# Patient Record
Sex: Female | Born: 1965 | Race: White | Marital: Married | Smoking: Never smoker
Health system: Southern US, Community
[De-identification: ages and names within clinical notes are randomized; demographics above are authoritative.]

## PROBLEM LIST (undated history)

## (undated) DIAGNOSIS — N3010 Interstitial cystitis (chronic) without hematuria: Secondary | ICD-10-CM

## (undated) HISTORY — PX: BLADDER SURGERY: SHX569

## (undated) HISTORY — PX: ABDOMINAL HYSTERECTOMY: SHX81

---

## 2015-06-02 ENCOUNTER — Other Ambulatory Visit: Payer: Self-pay | Admitting: Family Medicine

## 2015-06-02 ENCOUNTER — Ambulatory Visit
Admission: RE | Admit: 2015-06-02 | Discharge: 2015-06-02 | Disposition: A | Discharge status: Home / Self Care | Payer: BLUE CROSS/BLUE SHIELD | Source: Ambulatory Visit | Attending: Family Medicine | Admitting: Family Medicine

## 2015-06-02 DIAGNOSIS — M25511 Pain in right shoulder: Secondary | ICD-10-CM

## 2017-07-21 ENCOUNTER — Encounter (HOSPITAL_BASED_OUTPATIENT_CLINIC_OR_DEPARTMENT_OTHER): Payer: Self-pay

## 2017-07-21 ENCOUNTER — Other Ambulatory Visit: Payer: Self-pay

## 2017-07-21 ENCOUNTER — Emergency Department (HOSPITAL_BASED_OUTPATIENT_CLINIC_OR_DEPARTMENT_OTHER)
Admission: EM | Admit: 2017-07-21 | Discharge: 2017-07-21 | Disposition: A | Discharge status: Home / Self Care | Payer: BLUE CROSS/BLUE SHIELD | Attending: Emergency Medicine | Admitting: Emergency Medicine

## 2017-07-21 DIAGNOSIS — L03115 Cellulitis of right lower limb: Secondary | ICD-10-CM | POA: Diagnosis not present

## 2017-07-21 DIAGNOSIS — L02415 Cutaneous abscess of right lower limb: Secondary | ICD-10-CM | POA: Diagnosis not present

## 2017-07-21 DIAGNOSIS — R110 Nausea: Secondary | ICD-10-CM | POA: Insufficient documentation

## 2017-07-21 DIAGNOSIS — L0390 Cellulitis, unspecified: Secondary | ICD-10-CM

## 2017-07-21 HISTORY — DX: Interstitial cystitis (chronic) without hematuria: N30.10

## 2017-07-21 MED ORDER — LIDOCAINE-EPINEPHRINE (PF) 1 %-1:200000 IJ SOLN
INTRAMUSCULAR | Status: AC
  Administered 2017-07-21: 10 mL
  Filled 2017-07-21: qty 10

## 2017-07-21 MED ORDER — DOXYCYCLINE HYCLATE 100 MG PO TABS
100.0000 mg | ORAL_TABLET | Freq: Once | ORAL | Status: AC
  Administered 2017-07-21: 100 mg via ORAL
  Filled 2017-07-21: qty 1

## 2017-07-21 MED ORDER — DOXYCYCLINE HYCLATE 100 MG PO CAPS: 100.0000 mg | ORAL_CAPSULE | Freq: Two times a day (BID) | ORAL | 0 refills | Status: AC

## 2017-07-21 MED ORDER — HYDROCODONE-ACETAMINOPHEN 5-325 MG PO TABS
1.0000 | ORAL_TABLET | Freq: Once | ORAL | Status: AC
  Administered 2017-07-21: 1 via ORAL
  Filled 2017-07-21: qty 1

## 2017-07-21 MED ORDER — HYDROCODONE-ACETAMINOPHEN 5-325 MG PO TABS: 1.0000 | ORAL_TABLET | Freq: Three times a day (TID) | ORAL | 0 refills | Status: AC | PRN

## 2017-07-21 MED ORDER — LIDOCAINE-EPINEPHRINE 2 %-1:100000 IJ SOLN
20.0000 mL | Freq: Once | INTRAMUSCULAR | Status: DC
  Filled 2017-07-21: qty 20

## 2017-07-21 NOTE — ED Triage Notes (Signed)
C/o ?abscess to right buttock area x 5 days-NAD-steady gait

## 2017-07-21 NOTE — Discharge Instructions (Addendum)
You were given a prescription for antibiotics. Please take the antibiotic prescription fully.   You were given a prescription for Norco.  You may take 1 Norco every 8 hours for your pain.  Do not take this medication when you are working, driving operating machinery or drinking alcohol.  Please follow-up for a wound recheck in 48 hours. Please return to the emergency room immediately if you experience any new or worsening symptoms or any symptoms that indicate worsening infection such as fevers, increased redness/swelling/pain, warmth, or drainage from the affected area.

## 2017-07-21 NOTE — ED Provider Notes (Signed)
[facility] EMERGENCY DEPARTMENT Provider Note   CSN: [id number] Arrival date & time: 07/21/17  1950     History   Chief Complaint Chief Complaint  Patient presents with  . Abscess    HPI April Castro is a 52 y.o. female.  HPI   Pt is a 52 yo female presenting to the ED today to be evaluated for an abscess to her right upper leg that began 5 days ago. States the area is red and painfull. Denies itching. States redness, swelling, and pain has been worsening.  States that pain in her leg radiates to her right lower back. No numbness to legs. No drainage from the area.   States she has been taking "colloidal silver" that she received at a health shop to help with the infection. She has been feeling nauseated after taking this medication.   Reports temp at home today was 100F. She did not take any medication to tx this. Denies abd pain or vomiting. Has chronic diarrhea. No other sxs.   Past Medical History:  Diagnosis Date  . IC (interstitial cystitis)     There are no active problems to display for this patient.   Past Surgical History:  Procedure Laterality Date  . ABDOMINAL HYSTERECTOMY    . BLADDER SURGERY       OB History   None      Home Medications    Prior to Admission medications   Medication Sig Start Date End Date Taking? Authorizing Provider  doxycycline (VIBRAMYCIN) 100 MG capsule Take 1 capsule (100 mg total) by mouth 2 (two) times daily for 7 days. 07/21/17 07/28/17  Needham Biggins S, PA-C  HYDROcodone-acetaminophen (NORCO/VICODIN) 5-325 MG tablet Take 1 tablet by mouth every 8 (eight) hours as needed. 07/21/17   Eithen Castiglia S, PA-C    Family History No family history on file.  Social History Social History   Tobacco Use  . Smoking status: Never Smoker  . Smokeless tobacco: Never Used  Substance Use Topics  . Alcohol use: Never    Frequency: Never  . Drug use: Never     Allergies   Septra  [sulfamethoxazole-trimethoprim]   Review of Systems Review of Systems  Constitutional: Negative for fever.  HENT: Negative for congestion.   Eyes: Negative for visual disturbance.  Respiratory: Negative for shortness of breath.   Cardiovascular: Negative for chest pain.  Gastrointestinal: Positive for diarrhea and nausea. Negative for abdominal pain, constipation and vomiting.  Genitourinary: Negative for flank pain.  Musculoskeletal: Positive for back pain.  Skin: Positive for color change and wound.  Neurological: Negative for headaches.     Physical Exam Updated Vital Signs BP 139/89 (BP Location: Left Arm)   Pulse 90   Temp 98.7 F (37.1 C) (Oral)   Resp 20   Ht 5\' 3"  (1.6 m)   Wt 62.6 kg (138 lb 0.1 oz)   SpO2 100%   BMI 24.45 kg/m   Physical Exam  Constitutional: She appears well-developed and well-nourished. No distress.  HENT:  Head: Normocephalic and atraumatic.  Eyes: Conjunctivae are normal.  Neck: Neck supple.  Cardiovascular: Normal rate, regular rhythm and normal heart sounds.  Pulmonary/Chest: Effort normal and breath sounds normal. She has no wheezes.  Abdominal: Soft. Bowel sounds are normal. There is no tenderness.  Musculoskeletal: Normal range of motion.  Neurological: She is alert.  Skin: Skin is warm and dry.  area of erythema and induration beneath the right buttock with overlying scab.  No drainage  noted.  Area is warm to touch.  No significant fluctuance noted.  Psychiatric: She has a normal mood and affect.  Nursing note and vitals reviewed.    ED Treatments / Results  Labs (all labs ordered are listed, but only abnormal results are displayed) Labs Reviewed - No data to display  EKG None  Radiology No results found.  Procedures .Marland KitchenIncision and Drainage Date/Time: 07/21/2017 10:14 PM Performed by: Karrie Meres, PA-C Authorized by: Karrie Meres, PA-C   Consent:    Consent obtained:  Verbal   Consent given by:  Patient  and parent   Risks discussed:  Bleeding, incomplete drainage and pain Location:    Type:  Abscess   Location:  Lower extremity Pre-procedure details:    Skin preparation:  Betadine Anesthesia (see MAR for exact dosages):    Anesthesia method:  Local infiltration   Local anesthetic:  Lidocaine 2% WITH epi Procedure type:    Complexity:  Simple Procedure details:    Incision types:  Stab incision   Scalpel blade:  11   Drainage:  Bloody   Drainage amount:  Scant   Wound treatment:  Wound left open   Packing materials:  None Post-procedure details:    Patient tolerance of procedure:  Tolerated well, no immediate complications   (including critical care time)  Medications Ordered in ED Medications  lidocaine-EPINEPHrine (XYLOCAINE W/EPI) 2 %-1:100000 (with pres) injection 20 mL (has no administration in time range)  lidocaine-EPINEPHrine (XYLOCAINE-EPINEPHrine) 1 %-1:200000 (PF) injection (has no administration in time range)  doxycycline (VIBRA-TABS) tablet 100 mg (has no administration in time range)  HYDROcodone-acetaminophen (NORCO/VICODIN) 5-325 MG per tablet 1 tablet (1 tablet Oral Given 07/21/17 2153)     Initial Impression / Assessment and Plan / ED Course  I have reviewed the triage vital signs and the nursing notes.  Pertinent labs & imaging results that were available during my care of the patient were reviewed by me and considered in my medical decision making (see chart for details).     Final Clinical Impressions(s) / ED Diagnoses   Final diagnoses:  Cellulitis, unspecified cellulitis site   Patient with skin abscess/cellulitis.  Incision and drainage was attempted and scant blood drained.  Abscess was not large enough to warrant packing or drain,  wound recheck in 2 days. Encouraged home warm soaks and flushing.  Mild signs of cellulitis is surrounding skin.  Area marked and pt encouraged to return if redness begins to streak, extends beyond the markings, and/or  fever or nausea/vomiting develop.  Pt is alert, oriented, NAD, afebrile, non tachycardic, nonseptic and nontoxic appearing.  Pt to be d/c on oral antibiotics with strict f/u instructions.  Also gave pain medication.  Return precautions discussed.   ED Discharge Orders        Ordered    doxycycline (VIBRAMYCIN) 100 MG capsule  2 times daily     07/21/17 2213    HYDROcodone-acetaminophen (NORCO/VICODIN) 5-325 MG tablet  Every 8 hours PRN     07/21/17 2213       Karrie Meres, PA-C 07/21/17 2216    Terrilee Files, MD 07/22/17 1240

## 2018-08-21 ENCOUNTER — Other Ambulatory Visit: Payer: Self-pay | Admitting: Family Medicine

## 2018-08-21 ENCOUNTER — Ambulatory Visit
Admission: RE | Admit: 2018-08-21 | Discharge: 2018-08-21 | Disposition: A | Discharge status: Home / Self Care | Payer: BC Managed Care – PPO | Source: Ambulatory Visit | Attending: Family Medicine | Admitting: Family Medicine

## 2018-08-21 DIAGNOSIS — M79671 Pain in right foot: Secondary | ICD-10-CM

## 2018-11-15 ENCOUNTER — Other Ambulatory Visit: Payer: Self-pay

## 2018-11-15 ENCOUNTER — Ambulatory Visit
Admission: RE | Admit: 2018-11-15 | Discharge: 2018-11-15 | Disposition: A | Discharge status: Home / Self Care | Payer: BC Managed Care – PPO | Source: Ambulatory Visit | Attending: Obstetrics and Gynecology | Admitting: Obstetrics and Gynecology

## 2018-11-15 DIAGNOSIS — R52 Pain, unspecified: Secondary | ICD-10-CM

## 2020-01-28 IMAGING — CR RIGHT FOOT COMPLETE - 3+ VIEW
3 series · 3 of 3 positions shown · non-contrast
Comparison: None.

CLINICAL DATA: Pain laterally

EXAM:
RIGHT FOOT COMPLETE - 3+ VIEW

[x foot ap right]
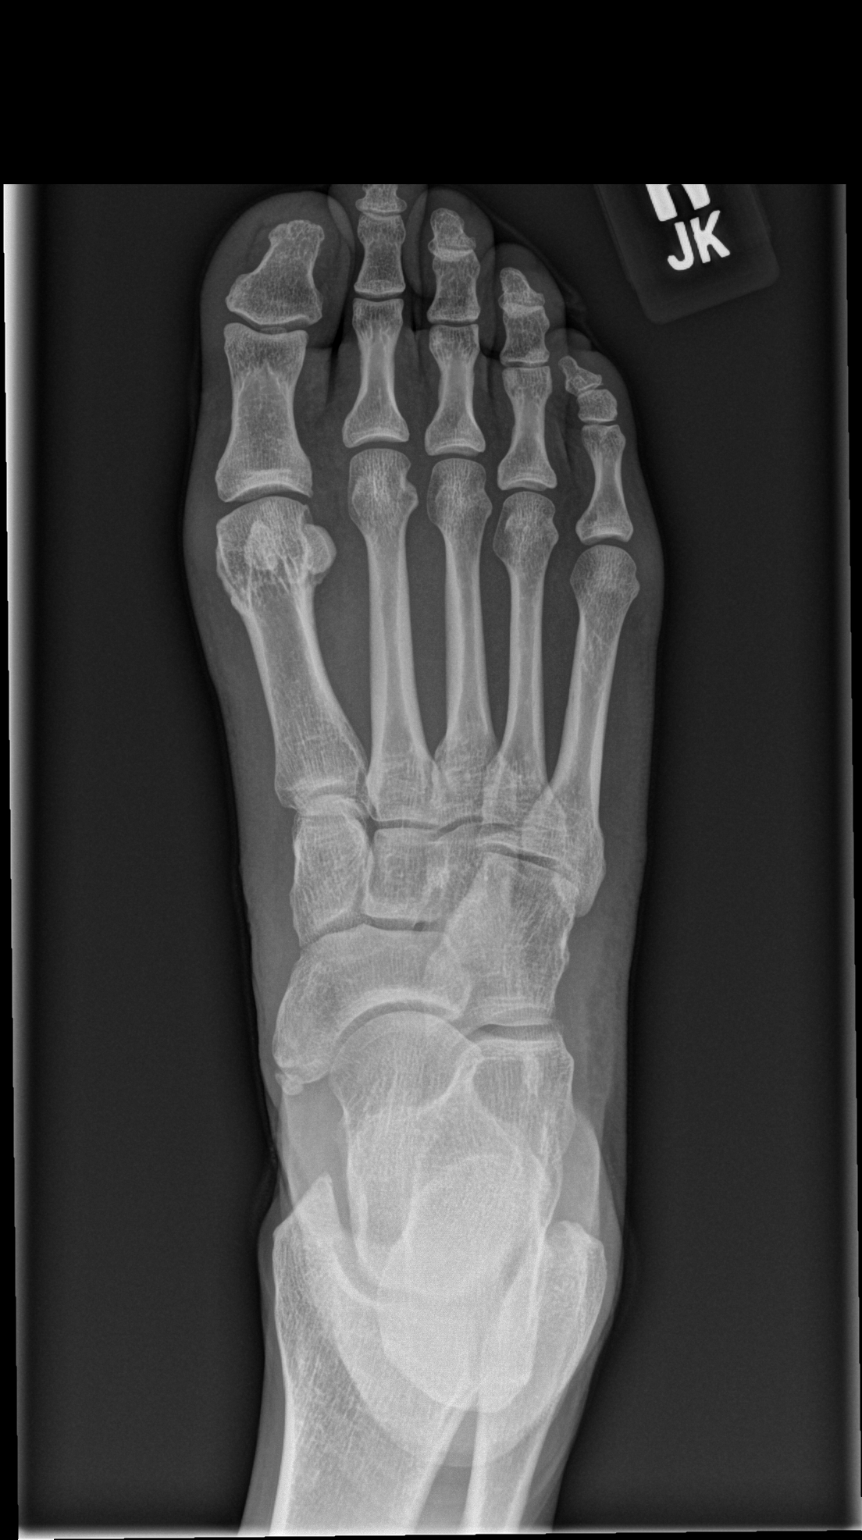

[x foot obl right]
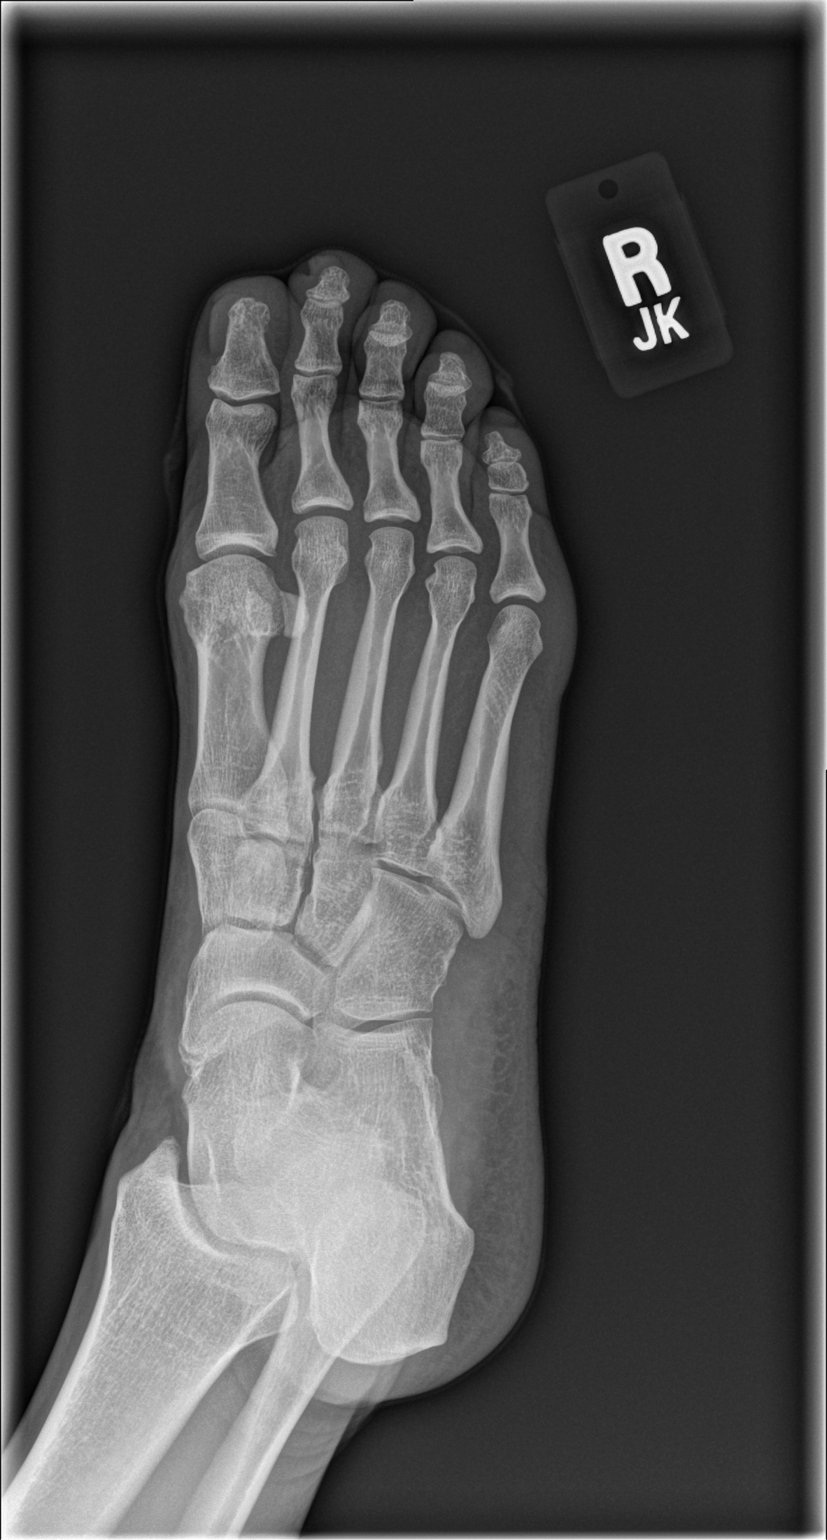

[x foot lat right]
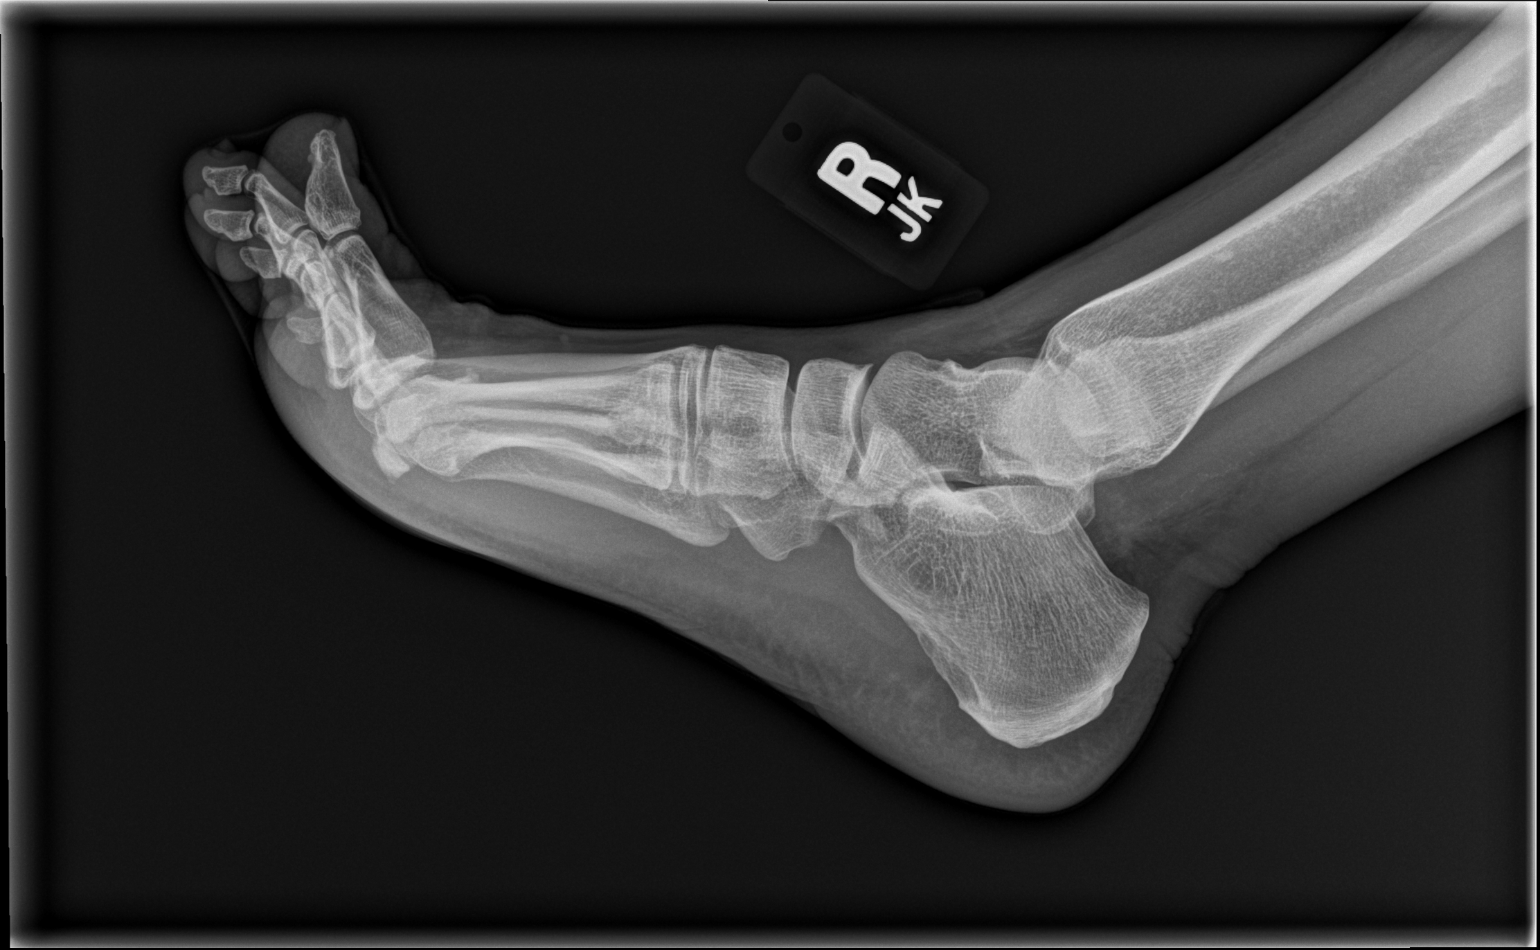

[3 of 3 positions shown; findings below may reference images not displayed]

FINDINGS: Frontal, oblique, and lateral views were obtained. No fracture or
dislocation. Joint spaces appear normal. No erosive change. There is
an os naviculare, an anatomic variant.
IMPRESSION: No fracture or dislocation.  No evident arthropathy.

## 2020-04-23 IMAGING — CR DG CHEST 2V
2 series · 2 of 2 positions shown · non-contrast
Comparison: None.

CLINICAL DATA: Left-sided chest pain for 2 weeks.

EXAM:
CHEST - 2 VIEW

[w chest pa]
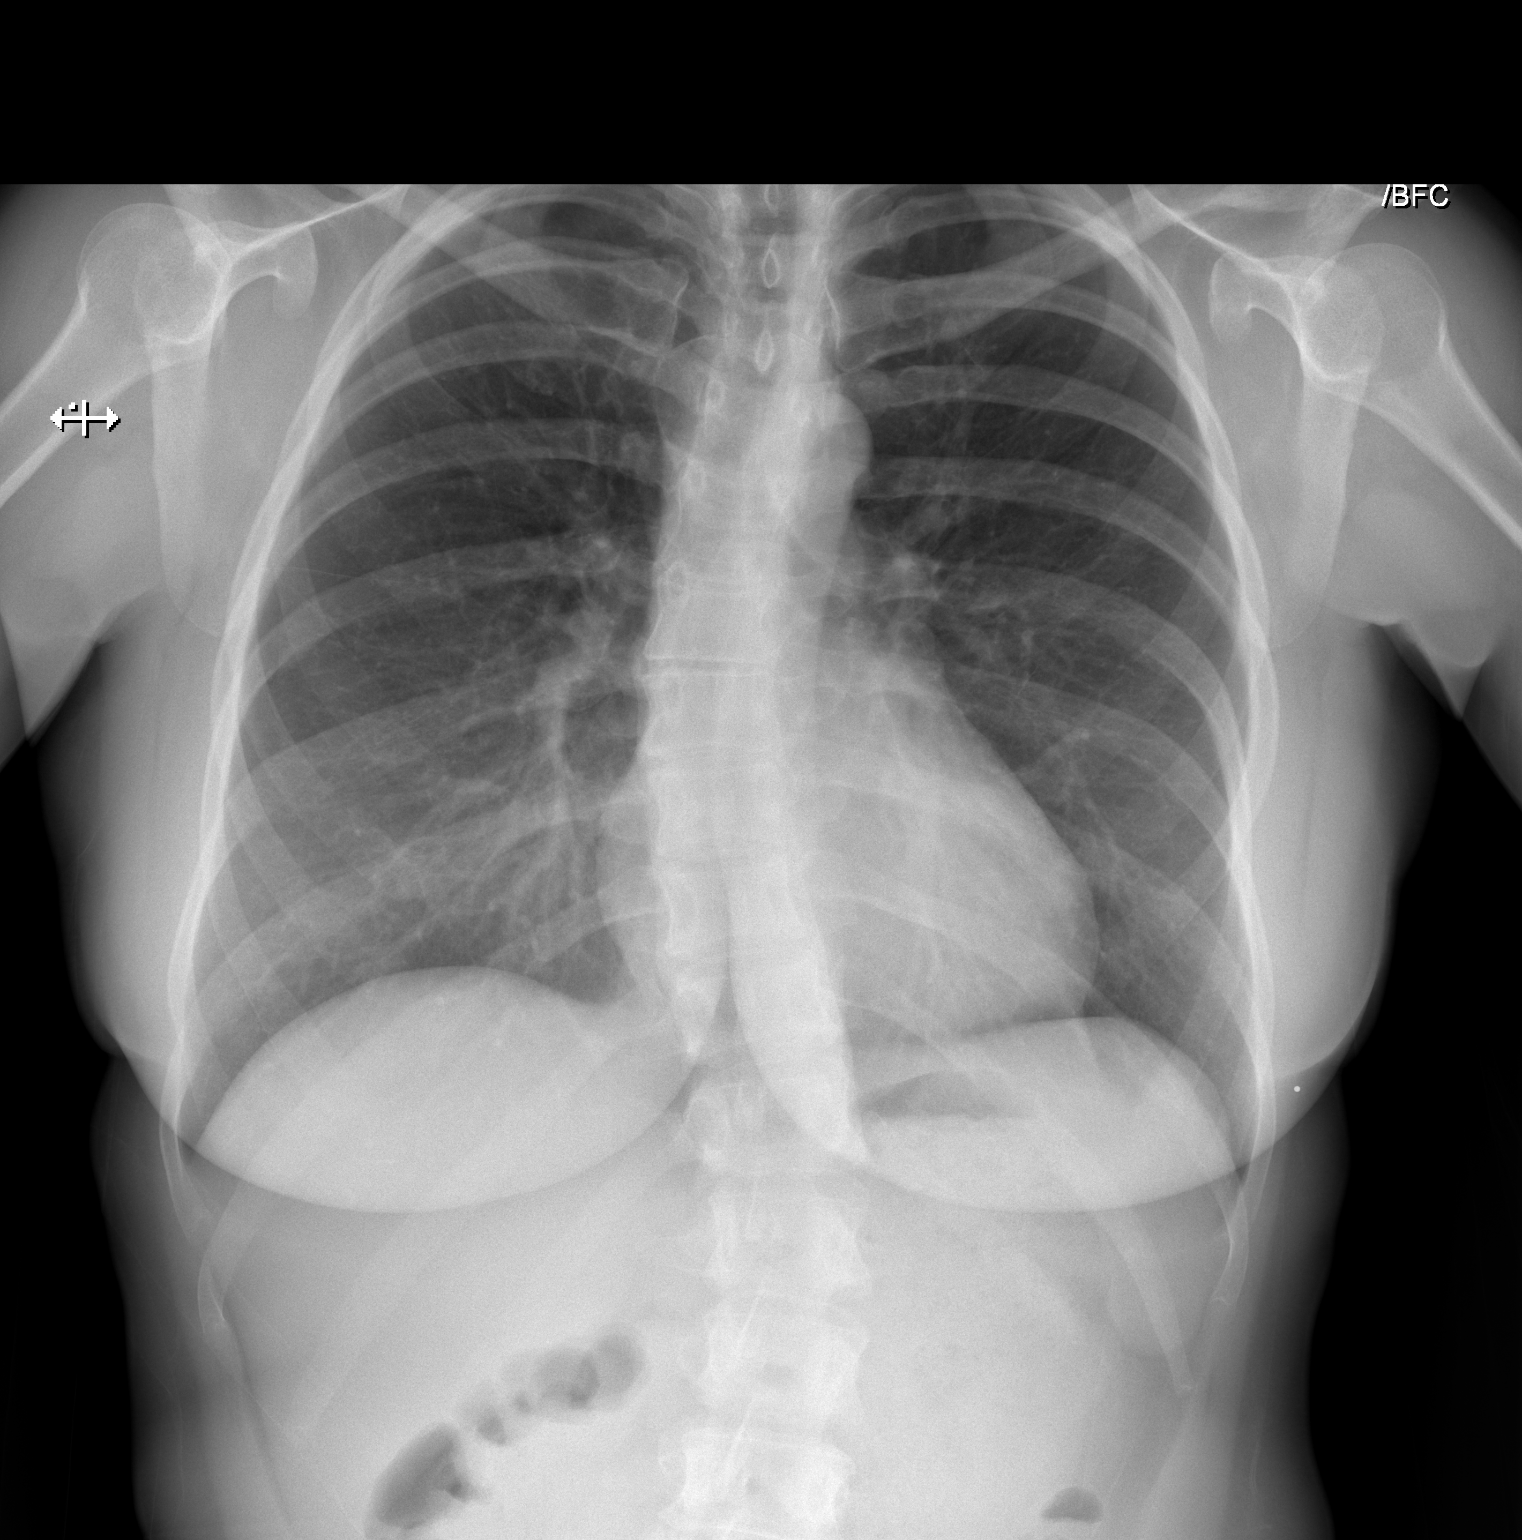

[w chest lat]
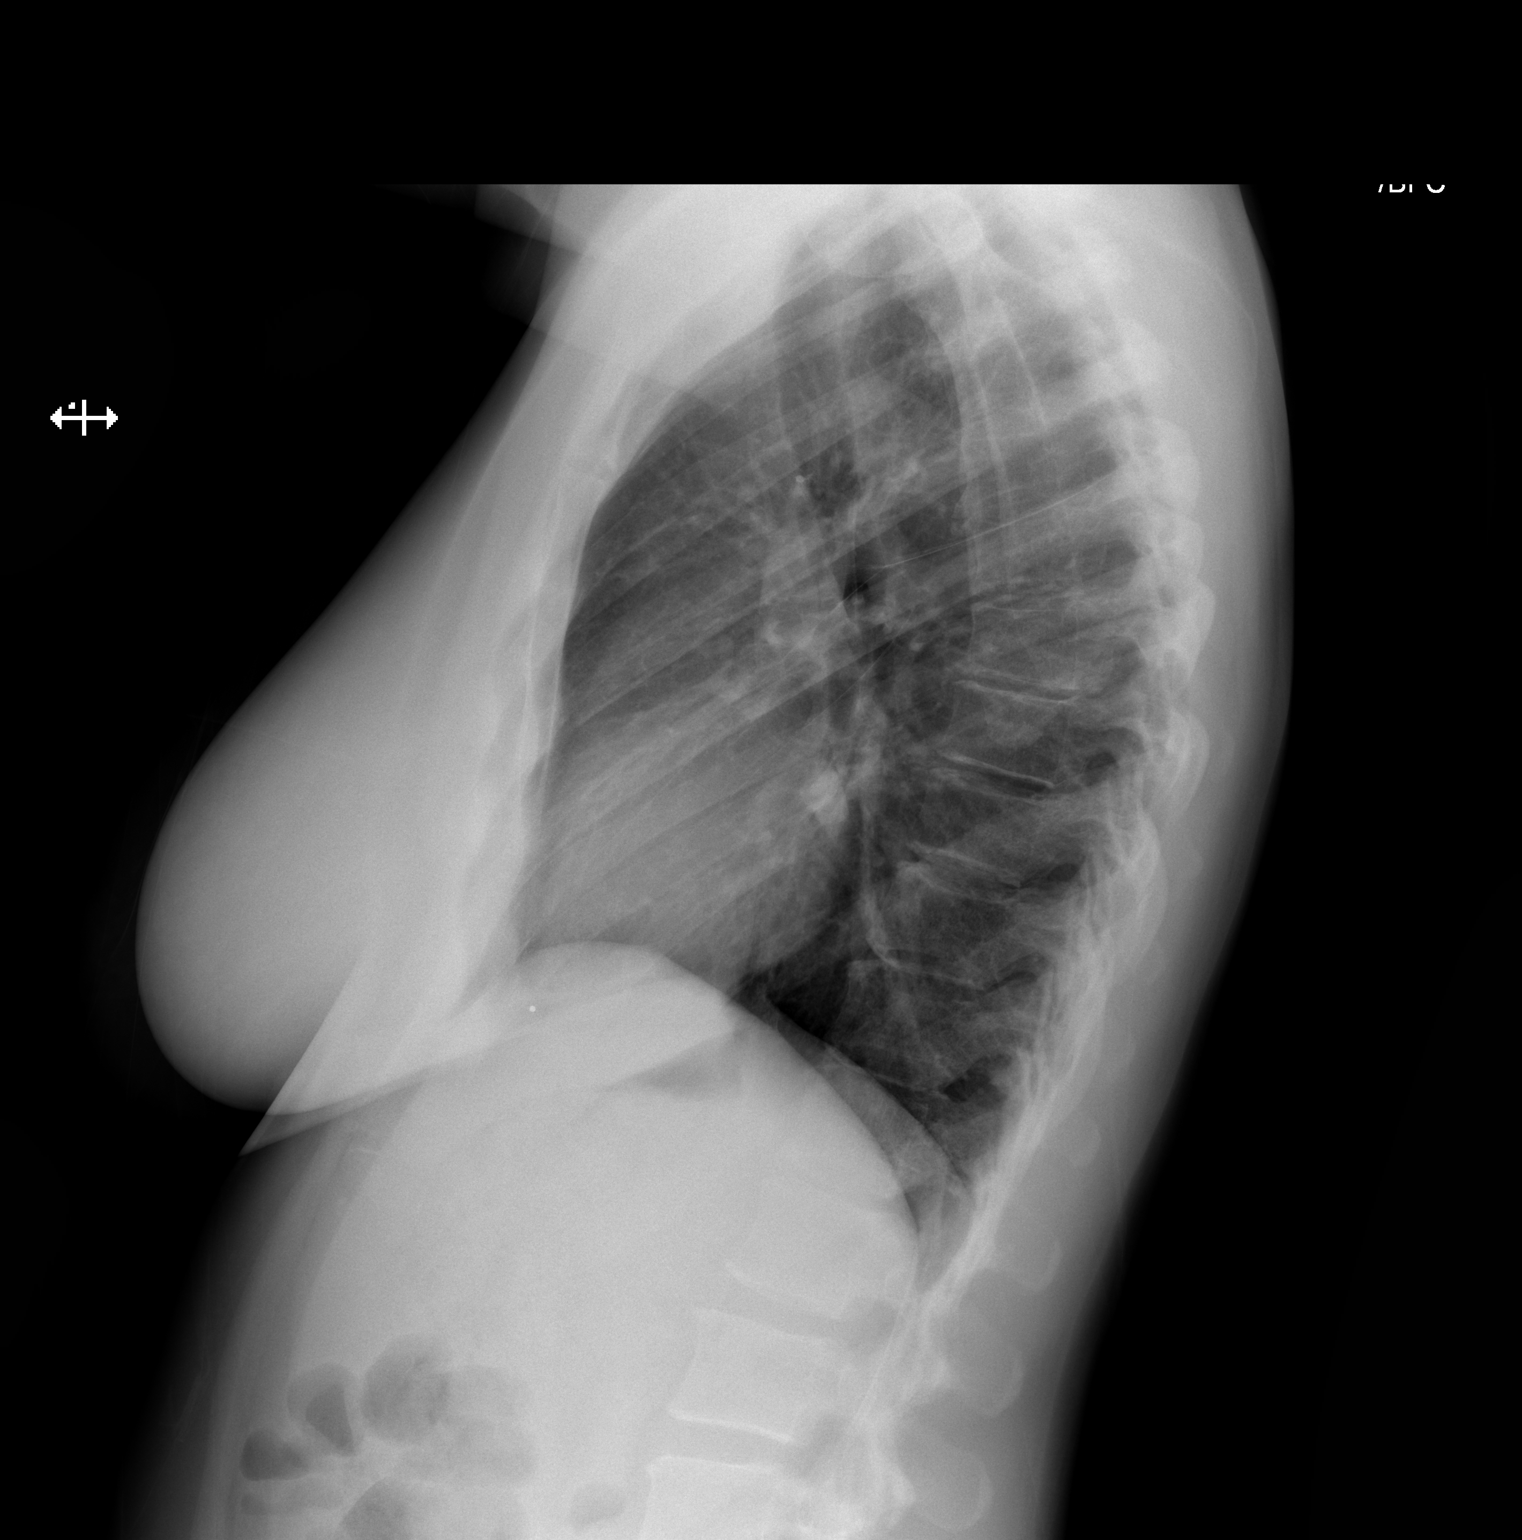

[2 of 2 positions shown; findings below may reference images not displayed]

FINDINGS: The heart size and mediastinal contours are within normal limits.
Both lungs are clear. No pneumothorax or pleural effusion is noted.
The visualized skeletal structures are unremarkable.
IMPRESSION: No active cardiopulmonary disease.
